# Patient Record
Sex: Female | Born: 1970 | Race: White | Hispanic: No | Marital: Married | Smoking: Current every day smoker
Health system: Southern US, Community
[De-identification: ages and names within clinical notes are randomized; demographics above are authoritative.]

## PROBLEM LIST (undated history)

## (undated) DIAGNOSIS — R87619 Unspecified abnormal cytological findings in specimens from cervix uteri: Secondary | ICD-10-CM

## (undated) DIAGNOSIS — N200 Calculus of kidney: Secondary | ICD-10-CM

## (undated) DIAGNOSIS — R32 Unspecified urinary incontinence: Secondary | ICD-10-CM

## (undated) DIAGNOSIS — F419 Anxiety disorder, unspecified: Secondary | ICD-10-CM

## (undated) HISTORY — PX: TUBAL LIGATION: SHX77

## (undated) HISTORY — PX: URETHRAL DILATION: SUR417

## (undated) HISTORY — DX: Unspecified urinary incontinence: R32

## (undated) HISTORY — DX: Calculus of kidney: N20.0

## (undated) HISTORY — PX: OTHER SURGICAL HISTORY: SHX169

## (undated) HISTORY — DX: Anxiety disorder, unspecified: F41.9

## (undated) HISTORY — DX: Unspecified abnormal cytological findings in specimens from cervix uteri: R87.619

---

## 2007-12-24 HISTORY — PX: BREAST SURGERY: SHX581

## 2007-12-24 HISTORY — PX: ABDOMINAL SURGERY: SHX537

## 2009-12-23 DIAGNOSIS — R87619 Unspecified abnormal cytological findings in specimens from cervix uteri: Secondary | ICD-10-CM

## 2009-12-23 HISTORY — PX: COLPOSCOPY: SHX161

## 2009-12-23 HISTORY — DX: Unspecified abnormal cytological findings in specimens from cervix uteri: R87.619

## 2013-03-24 ENCOUNTER — Ambulatory Visit
Admission: RE | Admit: 2013-03-24 | Discharge: 2013-03-24 | Disposition: A | Discharge status: Home / Self Care | Payer: Medicaid Other | Source: Ambulatory Visit | Attending: Otolaryngology | Admitting: Otolaryngology

## 2013-03-24 ENCOUNTER — Other Ambulatory Visit: Payer: Self-pay | Admitting: Otolaryngology

## 2013-03-24 DIAGNOSIS — H9209 Otalgia, unspecified ear: Secondary | ICD-10-CM

## 2013-03-24 DIAGNOSIS — H7090 Unspecified mastoiditis, unspecified ear: Secondary | ICD-10-CM

## 2013-03-24 DIAGNOSIS — H6690 Otitis media, unspecified, unspecified ear: Secondary | ICD-10-CM

## 2013-03-24 MED ORDER — IOHEXOL 300 MG/ML  SOLN
75.0000 mL | Freq: Once | INTRAMUSCULAR | Status: AC | PRN
  Administered 2013-03-24: 75 mL via INTRAVENOUS

## 2013-03-24 MED ORDER — IOHEXOL 300 MG/ML  SOLN: 75.0000 mL | Freq: Once | INTRAMUSCULAR | Status: DC | PRN

## 2013-09-09 ENCOUNTER — Other Ambulatory Visit: Payer: Self-pay | Admitting: Obstetrics and Gynecology

## 2013-09-09 DIAGNOSIS — R928 Other abnormal and inconclusive findings on diagnostic imaging of breast: Secondary | ICD-10-CM

## 2013-11-09 ENCOUNTER — Other Ambulatory Visit: Payer: Self-pay | Admitting: Obstetrics and Gynecology

## 2013-11-09 ENCOUNTER — Ambulatory Visit
Admission: RE | Admit: 2013-11-09 | Discharge: 2013-11-09 | Disposition: A | Discharge status: Home / Self Care | Payer: Medicaid Other | Source: Ambulatory Visit | Attending: Obstetrics and Gynecology | Admitting: Obstetrics and Gynecology

## 2013-11-09 ENCOUNTER — Ambulatory Visit
Admission: RE | Admit: 2013-11-09 | Discharge: 2013-11-09 | Disposition: A | Discharge status: Home / Self Care | Payer: BC Managed Care – PPO | Source: Ambulatory Visit | Attending: Obstetrics and Gynecology | Admitting: Obstetrics and Gynecology

## 2013-11-09 DIAGNOSIS — R928 Other abnormal and inconclusive findings on diagnostic imaging of breast: Secondary | ICD-10-CM

## 2014-03-15 ENCOUNTER — Ambulatory Visit (INDEPENDENT_AMBULATORY_CARE_PROVIDER_SITE_OTHER): Payer: BC Managed Care – PPO | Admitting: Podiatrist

## 2014-03-15 ENCOUNTER — Encounter: Payer: Self-pay | Admitting: Podiatrist

## 2014-03-15 VITALS — BP 112/71 | HR 64 | Resp 18

## 2014-03-15 DIAGNOSIS — S92309A Fracture of unspecified metatarsal bone(s), unspecified foot, initial encounter for closed fracture: Secondary | ICD-10-CM

## 2014-03-15 DIAGNOSIS — S9030XA Contusion of unspecified foot, initial encounter: Secondary | ICD-10-CM

## 2014-03-15 NOTE — Patient Instructions (Addendum)
Metatarsal Fracture, Undisplaced A metatarsal fracture is a break in the bone(s) of the foot. These are the bones of the foot that connect your toes to the bones of the ankle. DIAGNOSIS  The diagnoses of these fractures are usually made with X-rays. If there are problems in the forefoot and x-rays are normal a later bone scan will usually make the diagnosis.  TREATMENT AND HOME CARE INSTRUCTIONS  Treatment may or may not include a cast or walking shoe. When casts are needed the use is usually for short periods of time so as not to slow down healing with muscle wasting (atrophy).  Activities should be stopped until further advised by your caregiver.  Wear shoes with adequate shock absorbing capabilities and stiff soles.  Alternative exercise may be undertaken while waiting for healing. These may include bicycling and swimming, or as your caregiver suggests.  It is important to keep all follow-up visits or specialty referrals. The failure to keep these appointments could result in improper bone healing and chronic pain or disability.  Warning: Do not drive a car or operate a motor vehicle until your caregiver specifically tells you it is safe to do so. IF YOU DO NOT HAVE A CAST OR SPLINT:  You may walk on your injured foot as tolerated or advised.  Do not put any weight on your injured foot for as long as directed by your caregiver. Slowly increase the amount of time you walk on the foot as the pain allows or as advised.  Use crutches until you can bear weight without pain. A gradual increase in weight bearing may help.  Apply ice to the injury for 15-20 minutes each hour while awake for the first 2 days. Put the ice in a plastic bag and place a towel between the bag of ice and your skin.  Only take over-the-counter or prescription medicines for pain, discomfort, or fever as directed by your caregiver. SEEK IMMEDIATE MEDICAL CARE IF:   Your cast gets damaged or breaks.  You have  continued severe pain or more swelling than you did before the cast was put on, or the pain is not controlled with medications.  Your skin or nails below the injury turn blue or grey, or feel cold or numb.  There is a bad smell, or new stains or pus-like (purulent) drainage coming from the cast. MAKE SURE YOU:   Understand these instructions.  Will watch your condition.  Will get help right away if you are not doing well or get worse. Document Released: 08/31/2002 Document Revised: 03/02/2012 Document Reviewed: 07/22/2008 [facility] Patient Information 2014 [place].

## 2014-03-15 NOTE — Progress Notes (Signed)
   Subjective:    Patient ID: Phill Mutter, female    DOB: 07/18/1971, 43 y.o.   MRN: [id number]  HPI I was in my yard Sunday march 22,2015 and it was a concrete stone that was dropped in mid air on my left foot and sore and tender and blue color on toes and a lot of swelling and went to urgent care on Monday the 23rd and they x-rays and crutches and shoe and pain medicine and I have kept it up    Review of Systems  All other systems reviewed and are negative.       Objective:   Physical Exam GENERAL APPEARANCE: Alert, conversant. Appropriately groomed. No acute distress.  VASCULAR: Pedal pulses palpable at 2/4 DP and PT bilateral.  Capillary refill time is immediate to all digits,  Proximal to distal cooling it warm to warm.  Digital hair growth is present bilateral significant swelling of the left foot is noted in comparison with the right. NEUROLOGIC: sensation is intact epicritically and protectively to 5.07 monofilament at 5/5 sites bilateral.  Light touch is intact bilateral, no pain out of proportion to touch or dorsiflexion and plantarflexion is noted. MUSCULOSKELETAL: acceptable muscle strength, tone and stability bilateral.  Again significant swelling is present left DERMATOLOGIC: Notable swelling is present left foot in comparison with the right. Small excoriation over the third metatarsal base is also present. No ecchymosis or bruising is noted.   X-rays from Dr. Saul Fordyce office are evaluated and show possible fracture at the base of the third metatarsal left. No other osseous abnormalities were noted.    Assessment & Plan:  Fracture third metatarsal left versus contusion left foot  Plan: Put her in an air fracture walker to support the foot more. Instructed her on continued ice and elevation. She has already started on on hydrocodone and will call if she needs more next week. I'll see her back in one month for re\re x-ray of the foot.

## 2014-04-12 ENCOUNTER — Ambulatory Visit (INDEPENDENT_AMBULATORY_CARE_PROVIDER_SITE_OTHER): Payer: BC Managed Care – PPO | Admitting: Podiatrist

## 2014-04-12 ENCOUNTER — Ambulatory Visit (INDEPENDENT_AMBULATORY_CARE_PROVIDER_SITE_OTHER): Payer: BC Managed Care – PPO

## 2014-04-12 ENCOUNTER — Encounter: Payer: Self-pay | Admitting: Podiatrist

## 2014-04-12 VITALS — BP 105/67 | HR 68 | Resp 18

## 2014-04-12 DIAGNOSIS — R52 Pain, unspecified: Secondary | ICD-10-CM

## 2014-04-12 DIAGNOSIS — S9030XA Contusion of unspecified foot, initial encounter: Secondary | ICD-10-CM

## 2014-04-12 NOTE — Progress Notes (Signed)
The 2nd and 3rd toes I can bend some but the 4th and 5th toes on my left foot I can move a little bit and I stopped wearing the big boot last week and I am in this shoe  Subjective: Lori Quinn presents today for followup of foot bruise and a suspected stress fracture left foot. She states she got out of the boot and started wearing her Darco shoe again. Overall she states she's significantly improved and the swelling has gone down. She is concerned however that she can't bend her toes as well as she did before the injury.  Objective: Excellent appearance of the left foot is seen. Some discomfort around the third metatarsal at the base is present. No bruising, no swelling, no sign of injury is present today. Significant improvement noted from previous visit. The digits 2, 3, 4 are less able to plantarflex as the right foot. However this should resolve on its own over time. X-rays are reviewed and show no sign of obvious fracture or deformity left foot.   Assessment: Contusion/sprain left foot  Plan: Recommended that she wean out of her Darco shoe into a good supportive running shoe. Discussed continuing to exercise the toes to regain her strength and flexibility. This should be self limiting and should improve on its own.

## 2014-06-06 ENCOUNTER — Other Ambulatory Visit: Payer: Self-pay | Admitting: Obstetrics and Gynecology

## 2014-06-06 DIAGNOSIS — N6489 Other specified disorders of breast: Secondary | ICD-10-CM

## 2014-07-04 ENCOUNTER — Ambulatory Visit
Admission: RE | Admit: 2014-07-04 | Discharge: 2014-07-04 | Disposition: A | Discharge status: Home / Self Care | Payer: BC Managed Care – PPO | Source: Ambulatory Visit | Attending: Obstetrics and Gynecology | Admitting: Obstetrics and Gynecology

## 2014-07-04 DIAGNOSIS — N6489 Other specified disorders of breast: Secondary | ICD-10-CM

## 2015-01-18 ENCOUNTER — Other Ambulatory Visit: Payer: Self-pay | Admitting: Obstetrics and Gynecology

## 2015-01-18 DIAGNOSIS — N6489 Other specified disorders of breast: Secondary | ICD-10-CM

## 2015-07-26 ENCOUNTER — Other Ambulatory Visit: Payer: Self-pay | Admitting: Obstetrics and Gynecology

## 2015-07-26 ENCOUNTER — Ambulatory Visit
Admission: RE | Admit: 2015-07-26 | Discharge: 2015-07-26 | Disposition: A | Discharge status: Home / Self Care | Payer: Federal, State, Local not specified - PPO | Source: Ambulatory Visit | Attending: Obstetrics and Gynecology | Admitting: Obstetrics and Gynecology

## 2015-07-26 DIAGNOSIS — N6489 Other specified disorders of breast: Secondary | ICD-10-CM

## 2018-03-14 ENCOUNTER — Ambulatory Visit (HOSPITAL_COMMUNITY)
Admission: EM | Admit: 2018-03-14 | Discharge: 2018-03-14 | Disposition: A | Discharge status: Home / Self Care | Payer: Federal, State, Local not specified - PPO | Attending: Family Medicine | Admitting: Family Medicine

## 2018-03-14 ENCOUNTER — Encounter (HOSPITAL_COMMUNITY): Payer: Self-pay | Admitting: Family Medicine

## 2018-03-14 ENCOUNTER — Other Ambulatory Visit: Payer: Self-pay

## 2018-03-14 DIAGNOSIS — J40 Bronchitis, not specified as acute or chronic: Secondary | ICD-10-CM | POA: Insufficient documentation

## 2018-03-14 DIAGNOSIS — H65111 Acute and subacute allergic otitis media (mucoid) (sanguinous) (serous), right ear: Secondary | ICD-10-CM | POA: Diagnosis not present

## 2018-03-14 DIAGNOSIS — R509 Fever, unspecified: Secondary | ICD-10-CM | POA: Diagnosis present

## 2018-03-14 DIAGNOSIS — F17210 Nicotine dependence, cigarettes, uncomplicated: Secondary | ICD-10-CM | POA: Diagnosis not present

## 2018-03-14 DIAGNOSIS — R51 Headache: Secondary | ICD-10-CM | POA: Diagnosis present

## 2018-03-14 DIAGNOSIS — Z881 Allergy status to other antibiotic agents status: Secondary | ICD-10-CM | POA: Diagnosis not present

## 2018-03-14 DIAGNOSIS — H65191 Other acute nonsuppurative otitis media, right ear: Secondary | ICD-10-CM | POA: Diagnosis not present

## 2018-03-14 LAB — POCT URINALYSIS DIP (DEVICE)
Bilirubin Urine: NEGATIVE
Glucose, UA: NEGATIVE mg/dL
HGB URINE DIPSTICK: NEGATIVE
Ketones, ur: NEGATIVE mg/dL
Leukocytes, UA: NEGATIVE
Nitrite: NEGATIVE
PH: 7 (ref 5.0–8.0)
Protein, ur: NEGATIVE mg/dL
Specific Gravity, Urine: 1.01 (ref 1.005–1.030)
UROBILINOGEN UA: 0.2 mg/dL (ref 0.0–1.0)

## 2018-03-14 MED ORDER — PREDNISONE 20 MG PO TABS: ORAL_TABLET | ORAL | 0 refills | Status: DC

## 2018-03-14 MED ORDER — AMOXICILLIN-POT CLAVULANATE 875-125 MG PO TABS: 1.0000 | ORAL_TABLET | Freq: Two times a day (BID) | ORAL | 0 refills | Status: DC

## 2018-03-14 NOTE — Discharge Instructions (Addendum)
The urine test was negative.

## 2018-03-14 NOTE — ED Provider Notes (Signed)
[place]   [id number] 03/14/18 Arrival Time: [place]   [id number] 03/14/18 Arrival Time: 1909      SUBJECTIVE:  Lori Quinn is a 47 y.o. female who presents to the urgent care with complaint of Fever, sinus, headaches, ear pain, pressure when she urinates, wheezing a little, facial pressure, ibuprofen 2 hours ago,   Patient has been sick off and on for a few days but got much worse today.  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 She smokes cigarettes.   History reviewed. No pertinent past medical history. No family history on file. Social History   Socioeconomic History  . Marital status: Married    Spouse name: Not on file  . Number of children: Not on file  . Years of education: Not on file  . Highest education level: Not on file  Occupational History  . Not on file  Social Needs  . Financial resource strain: Not on file  . Food insecurity:    Worry: Not on file    Inability: Not on file  . Transportation needs:    Medical: Not on file    Non-medical: Not on file  Tobacco Use  . Smoking status: Current Every Day Smoker    Packs/day: 1.00    Types: Cigarettes  . Smokeless tobacco: Never Used  Substance and Sexual Activity  . Alcohol use: Yes  . Drug use: No  . Sexual activity: Not on file  Lifestyle  . Physical activity:    Days per week: Not on file    Minutes per session: Not on file  . Stress: Not on file  Relationships  . Social connections:    Talks on phone: Not on file    Gets together: Not on file    Attends religious service: Not on file    Active member of club or organization: Not on file    Attends meetings of clubs or organizations: Not on file    Relationship status: Not on file  . Intimate partner violence:    Fear of current or ex partner: Not on file    Emotionally abused: Not on file    Physically abused: Not on file    Forced sexual activity: Not on file  Other  Topics Concern  . Not on file  Social History Narrative  . Not on file   Current Meds  Medication Sig  . amphetamine-dextroamphetamine (ADDERALL) 20 MG tablet Take 20 mg by mouth daily.   Allergies  Allergen Reactions  . Cefdinir Nausea And Vomiting      ROS: As per HPI, remainder of ROS negative.   OBJECTIVE:   Vitals:   03/14/18 1921  BP: 133/79  Pulse: 80  Temp: 97.8 F (36.6 C)  TempSrc: Oral  SpO2: 100%     General appearance: alert; no distress Eyes: PERRL; EOMI; conjunctiva normal HENT: normocephalic; atraumatic; TMs dull on right and normal on left, canal normal, external ears normal without trauma; nasal mucosa normal; oral mucosa normal; mild maxillary tenderness Neck: supple Lungs: Expiratory wheezes on auscultation bilaterally Heart: regular rate and rhythm Back: no CVA tenderness Extremities: no cyanosis or edema; symmetrical with no gross deformities Skin: warm and dry Neurologic: normal gait; grossly normal Psychological: alert and cooperative; normal mood and affect      Labs:  Results for orders placed or performed during the hospital encounter of 03/14/18  POCT urinalysis dip (device)  Result Value Ref Range   Glucose, UA NEGATIVE NEGATIVE mg/dL   Bilirubin Urine NEGATIVE NEGATIVE   Ketones, ur NEGATIVE NEGATIVE mg/dL   Specific Gravity, Urine 1.010 1.005 - 1.030   Hgb urine dipstick NEGATIVE NEGATIVE   pH 7.0 5.0 - 8.0   Protein, ur NEGATIVE NEGATIVE mg/dL   Urobilinogen, UA 0.2 0.0 - 1.0 mg/dL   Nitrite NEGATIVE NEGATIVE   Leukocytes, UA NEGATIVE NEGATIVE    Labs Reviewed  URINE CULTURE  POCT URINALYSIS DIP (DEVICE)    No results found.     ASSESSMENT & PLAN:  1. Acute mucoid otitis media of right ear   2. Bronchitis     Meds ordered this encounter  Medications  . amoxicillin-clavulanate (AUGMENTIN) 875-125 MG tablet    Sig: Take 1 tablet by mouth every 12 (twelve) hours.    Dispense:  14 tablet    Refill:  0    . predniSONE (DELTASONE) 20 MG tablet    Sig: Two daily with food    Dispense:  10 tablet    Refill:  0    Reviewed expectations re: course of current medical issues. Questions answered. Outlined signs and symptoms indicating need for more acute intervention. Patient verbalized understanding. After Visit Summary given.       History reviewed. No pertinent past medical history. No family history on file. Social History   Socioeconomic History  . Marital status: Married    Spouse name: Not on file  . Number of children: Not on file  . Years of education: Not on file  . Highest education level: Not on file  Occupational History  . Not on file  Social Needs  . Financial resource strain: Not on file  . Food insecurity:    Worry: Not on file    Inability: Not on file  . Transportation needs:    Medical: Not on file    Non-medical: Not on file  Tobacco Use  . Smoking status: Current Every Day Smoker    Packs/day: 1.00    Types: Cigarettes  . Smokeless tobacco: Never Used  Substance and Sexual Activity  . Alcohol use: Yes  . Drug use: No  . Sexual activity: Not on file  Lifestyle  . Physical activity:    Days per week: Not on file    Minutes per session: Not on file  . Stress: Not on file  Relationships  . Social connections:    Talks on phone: Not on file    Gets together: Not on file    Attends religious service: Not on file    Active member of club or organization: Not on file    Attends meetings of clubs or organizations: Not on file    Relationship status: Not on file  . Intimate partner violence:    Fear of current or ex partner: Not on file    Emotionally abused: Not on file    Physically abused: Not on file    Forced sexual activity: Not on file  Other Topics Concern  . Not on file  Social History Narrative  . Not on file   Current Meds  Medication Sig  . amphetamine-dextroamphetamine (ADDERALL) 20 MG tablet Take 20 mg by mouth daily.    Allergies  Allergen Reactions  . Cefdinir Nausea And Vomiting      ROS: As per HPI, remainder of ROS negative.   OBJECTIVE:   Vitals:   03/14/18 1921  BP: 133/79  Pulse: 80  Temp: 97.8  F (36.6 C)  TempSrc: Oral  SpO2: 100%     General appearance: alert; no distress Eyes: PERRL; EOMI; conjunctiva normal HENT: normocephalic; atraumatic; TMs normal, canal normal, external ears normal without trauma; nasal mucosa normal; oral mucosa normal Neck: supple Lungs: clear to auscultation bilaterally Heart: regular rate and rhythm Abdomen: soft, non-tender; bowel sounds normal; no masses or organomegaly; no guarding or rebound tenderness Back: no CVA tenderness Extremities: no cyanosis or edema; symmetrical with no gross deformities Skin: warm and dry Neurologic: normal gait; grossly normal Psychological: alert and cooperative; normal mood and affect      Labs:  Results for orders placed or performed during the hospital encounter of 03/14/18  POCT urinalysis dip (device)  Result Value Ref Range   Glucose, UA NEGATIVE NEGATIVE mg/dL   Bilirubin Urine NEGATIVE NEGATIVE   Ketones, ur NEGATIVE NEGATIVE mg/dL   Specific Gravity, Urine 1.010 1.005 - 1.030   Hgb urine dipstick NEGATIVE NEGATIVE   pH 7.0 5.0 - 8.0   Protein, ur NEGATIVE NEGATIVE mg/dL   Urobilinogen, UA 0.2 0.0 - 1.0 mg/dL   Nitrite NEGATIVE NEGATIVE   Leukocytes, UA NEGATIVE NEGATIVE    Labs Reviewed  URINE CULTURE  POCT URINALYSIS DIP (DEVICE)    No results found.     ASSESSMENT & PLAN:  1. Acute mucoid otitis media of right ear   2. Bronchitis     Meds ordered this encounter  Medications  . amoxicillin-clavulanate (AUGMENTIN) 875-125 MG tablet    Sig: Take 1 tablet by mouth every 12 (twelve) hours.    Dispense:  14 tablet    Refill:  0  . predniSONE (DELTASONE) 20 MG tablet    Sig: Two daily with food    Dispense:  10 tablet    Refill:  0    Reviewed expectations re: course of  current medical issues. Questions answered. Outlined signs and symptoms indicating need for more acute intervention. Patient verbalized understanding. After Visit Summary given.    Procedures:      Robyn Haber, MD 03/14/18 2000

## 2018-03-14 NOTE — ED Triage Notes (Addendum)
Fever, sinus, headaches, ear pain, pressure when she urinates, wheezing a little, facial pressure, ibuprofen 2 hours ago,

## 2018-03-15 LAB — URINE CULTURE: Culture: NO GROWTH

## 2018-08-26 ENCOUNTER — Telehealth: Payer: Self-pay | Admitting: Obstetrics and Gynecology

## 2018-08-26 NOTE — Telephone Encounter (Signed)
Called and left a message for patient to call back to schedule a new patient doctor referral appointment with our office to see Dr. Quincy Simmonds for: Female stress incontinence.

## 2018-08-27 NOTE — Telephone Encounter (Signed)
Called and left a message for patient to call back to schedule a new patient doctor referral appointment with our office to see Dr. Quincy Simmonds for: Female stress incontinence.

## 2018-08-31 NOTE — Telephone Encounter (Signed)
Called and left a message for patient to call back to schedule a new patient doctor referral appointment with our officeto see Dr. Quincy Simmonds for: Female stress incontinence.

## 2018-09-02 NOTE — Telephone Encounter (Signed)
Routing referral back to referring office. Patient has not returned multiple calls to schedule an appointment with our office.

## 2018-12-09 ENCOUNTER — Encounter

## 2018-12-09 ENCOUNTER — Encounter: Payer: Self-pay | Admitting: Obstetrics and Gynecology

## 2018-12-09 ENCOUNTER — Other Ambulatory Visit: Payer: Self-pay

## 2018-12-09 ENCOUNTER — Ambulatory Visit (INDEPENDENT_AMBULATORY_CARE_PROVIDER_SITE_OTHER): Payer: Federal, State, Local not specified - PPO | Admitting: Obstetrics and Gynecology

## 2018-12-09 VITALS — BP 120/78 | HR 80 | Resp 18 | Ht 66.0 in | Wt 140.4 lb

## 2018-12-09 DIAGNOSIS — N3946 Mixed incontinence: Secondary | ICD-10-CM | POA: Diagnosis not present

## 2018-12-09 DIAGNOSIS — N8110 Cystocele, unspecified: Secondary | ICD-10-CM

## 2018-12-09 NOTE — Progress Notes (Signed)
47 y.o. [id number] Married Caucasian female here for uterine prolapse.    Having bladder problems.  Leaking urine with cough, exercise, laugh, or very little straining.  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  No leak for no reason.  Using a panty liner.   DF - every 3 - 4 hours. NF - none.  No enuresis.   Does not think she voids well.   Has of a lot of UTIs, none recently.  Last UTI was July 2019.  Prior UTI she does not remember.  Hx renal stones.   Denies hematuria.  Does splinting on the perineum to have BMs, not very often.  Can use a stool softener sometimes.  No fecal incontinence.   Has pelvic heaviness.   2 menses in the last year.   Patient state she is looking for guidance regarding what she should do for her care.   PCP:  None Referred by Dr.Robert Stann [place]   Patient's last menstrual period was 10/27/2018 (exact date).     Period Pattern: (!) Irregular     Sexually active: Yes.   female The current method of family planning is tubal ligation/ablation.    Exercising: No.  The patient does not participate in regular exercise at present. Smoker:  Yes, smokes 1ppd  Health Maintenance: Pap:  11/2017 normal per patient History of abnormal Pap:  Yes, hx of colpo 2011--paps normal since MMG: 11/2017 normal per patient at Dr.Wein's office Colonoscopy: At age 55 for rectal bleeding/polyps;was told to repeat 10-15 years BMD:   n/a  Result  n/a TDaP:  2012 Gardasil:   no HIV: Neg years ago Hep C:Neg years ago    reports that she has been smoking cigarettes. She has been smoking about 1.00 pack per day. She has never used smokeless tobacco. She reports previous alcohol use. She reports that she does not use drugs.  Past Medical History:  Diagnosis Date  . Abnormal Pap smear of cervix 2011   had colposcopy with Dr.Wein--paps normal since  . Anxiety   . Renal stones   . Urinary incontinence     Past Surgical History:  Procedure  Laterality Date  . ABDOMINAL SURGERY  12/24/2007   tummy tuck  . abdominoplasty surgery    . BREAST SURGERY  12/24/2007   Saline breast implants  . COLPOSCOPY  2011   Dr.Wein  . TUBAL LIGATION    . URETHRAL DILATION     done at 47 yo    Current Outpatient Medications  Medication Sig Dispense Refill  . amphetamine-dextroamphetamine (ADDERALL) 20 MG tablet Take 20 mg by mouth daily.    . cyclobenzaprine (FLEXERIL) 10 MG tablet Take 10 mg by mouth 3 (three) times daily as needed for muscle spasms.     No current facility-administered medications for this visit.     Family History  Problem Relation Age of Onset  . Breast cancer Mother 44  . Hypertension Mother   . Hypertension Father   . Heart attack Father   . Heart attack Brother   . Diabetes Paternal Grandmother   . Stroke Paternal Grandmother   . Hypertension Paternal Grandfather   . Heart attack Paternal Grandfather   . Hypertension Brother     Review of Systems  All other systems reviewed and are negative.   Exam:   BP 120/78 (BP Location: Right Arm, Patient Position: Sitting, Cuff Size: Normal)   Pulse 80   Resp 18  Ht 5' 6"  (1.676 m)   Wt 140 lb 6.4 oz (63.7 kg)   LMP 10/27/2018 (Exact Date)   BMI 22.66 kg/m     General appearance: alert, cooperative and appears stated age Head: Normocephalic, without obvious abnormality, atraumatic Neck: no adenopathy, supple, symmetrical, trachea midline and thyroid normal to inspection and palpation Lungs: clear to auscultation bilaterally Heart: regular rate and rhythm Abdomen: soft, non-tender; no masses, no organomegaly Extremities: extremities normal, atraumatic, no cyanosis or edema Skin: Skin color, texture, turgor normal. No rashes or lesions Lymph nodes: Cervical, supraclavicular, and axillary nodes normal. No abnormal inguinal nodes palpated Neurologic: Grossly normal  Pelvic: External genitalia:  no lesions              Urethra:  normal appearing urethra  with no masses, tenderness or lesions              Bartholins and Skenes: normal                 Vagina: normal appearing vagina with normal color and discharge, no lesions.  First degree prolapse of bladder.  Good apical and posterior vaginal support.               Cervix: no lesions       Bimanual Exam:  Uterus:  normal size, contour, position, consistency, mobility, non-tender              Adnexa: no mass, fullness, tenderness              Rectal exam: Yes.  .  Confirms.              Anus:  normal sphincter tone, no lesions  Chaperone was present for exam.  Assessment:    Mixed incontinence.  Stress greater than urge.  Cystocele.  Status post BTL.  Smoker.   Plan:  We discussed her incontinence and prolapse including etiologies and options for care. I did address smoking as a risk factor for prolapse and incontinence.  Treatment options include physical therapy, anticholinergic/antimuscarininc therapy, reduction of bladder irritant use, Impressa, pessary use, and surgical repair with midurethal sling/cystoscopy.  I reviewed midurthral sling risks and benefits.  ACOG materials given to patient on prolapse and incontinence in general and also surgical care for prolapse and incontinence.  Patient will start with referral for physical therapy.  I reinforced that her care plan can be modified if she wishes and that the goal is to improve the quality of her life and functioning.  If surgery is chosen, she will need urodynamic testing.  Procedure explained.  Questions invited and answered.   ___45____ minutes face to face time of which over 50% was spent in counseling.   After visit summary provided.

## 2018-12-12 DIAGNOSIS — N8110 Cystocele, unspecified: Secondary | ICD-10-CM | POA: Insufficient documentation

## 2018-12-12 DIAGNOSIS — N3946 Mixed incontinence: Secondary | ICD-10-CM | POA: Insufficient documentation

## 2018-12-12 NOTE — Addendum Note (Signed)
Addended by: Yisroel Ramming, Dietrich Pates E on: 12/12/2018 07:17 PM   Modules accepted: Orders

## 2018-12-30 ENCOUNTER — Telehealth: Payer: Self-pay | Admitting: Obstetrics and Gynecology

## 2018-12-30 NOTE — Telephone Encounter (Signed)
Call placed in reference to referral to [facility].

## 2019-02-02 ENCOUNTER — Telehealth: Payer: Self-pay | Admitting: Obstetrics and Gynecology

## 2019-02-02 NOTE — Telephone Encounter (Signed)
-----   Message ----- From: Nunzio Cobbs, MD Sent: 02/02/2019  12:06 PM EST To: Lucienne Minks Subject: RE: Urology- Referral                          Ok to close referral.   Thank you,   Brook   ----- Message ----- From: Lucienne Minks Sent: 02/02/2019  11:45 AM EST To: Brook Oletta Lamas, MD Subject: Urology- Referral                              Dr. Quincy Simmonds,  Per [place] at [facility]- she has called the patient and left several messages no response. I have also called the patient and left a message for a return call. Please adivise on how to proceed with the referral.

## 2022-02-06 ENCOUNTER — Other Ambulatory Visit: Payer: Self-pay | Admitting: Obstetrics & Gynecology

## 2022-02-06 ENCOUNTER — Ambulatory Visit
Admission: RE | Admit: 2022-02-06 | Discharge: 2022-02-06 | Disposition: A | Discharge status: Home / Self Care | Payer: Federal, State, Local not specified - PPO | Source: Ambulatory Visit | Attending: Obstetrics & Gynecology | Admitting: Obstetrics & Gynecology

## 2022-02-06 DIAGNOSIS — Z1231 Encounter for screening mammogram for malignant neoplasm of breast: Secondary | ICD-10-CM

## 2022-02-28 LAB — EXTERNAL GENERIC LAB PROCEDURE: COLOGUARD: NEGATIVE

## 2023-01-27 ENCOUNTER — Other Ambulatory Visit: Payer: Self-pay | Admitting: Obstetrics & Gynecology

## 2023-01-27 DIAGNOSIS — Z1231 Encounter for screening mammogram for malignant neoplasm of breast: Secondary | ICD-10-CM

## 2023-03-11 ENCOUNTER — Ambulatory Visit
Admission: RE | Admit: 2023-03-11 | Discharge: 2023-03-11 | Disposition: A | Discharge status: Home / Self Care | Payer: Federal, State, Local not specified - PPO | Source: Ambulatory Visit | Attending: Obstetrics & Gynecology | Admitting: Obstetrics & Gynecology

## 2023-03-11 DIAGNOSIS — Z1231 Encounter for screening mammogram for malignant neoplasm of breast: Secondary | ICD-10-CM

## 2023-11-02 IMAGING — MG DIGITAL SCREENING BREAST BILAT IMPLANT W/ TOMO W/ CAD
8 of 12 series · 8 of 28 positions shown · non-contrast
Comparison: Previous exam(s).

CLINICAL DATA: Screening.

EXAM:
DIGITAL SCREENING BILATERAL MAMMOGRAM WITH IMPLANTS, CAD AND
TOMOSYNTHESIS
TECHNIQUE: Bilateral screening digital craniocaudal and mediolateral oblique
mammograms were obtained. Bilateral screening digital breast
tomosynthesis was performed. The images were evaluated with
computer-aided detection. Standard and/or implant displaced views
were performed.

[R CC]
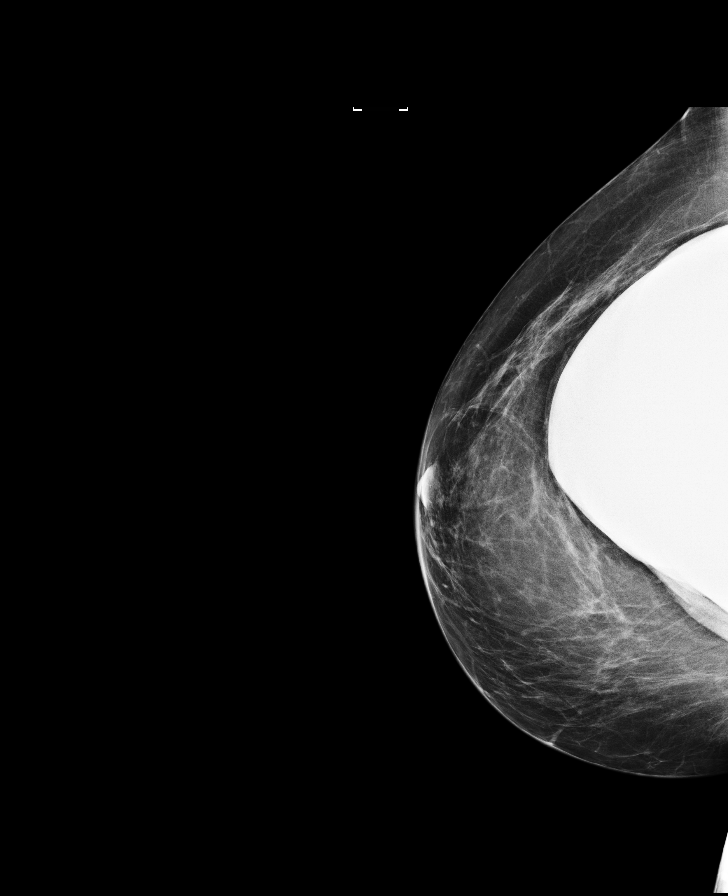

[L MLO]
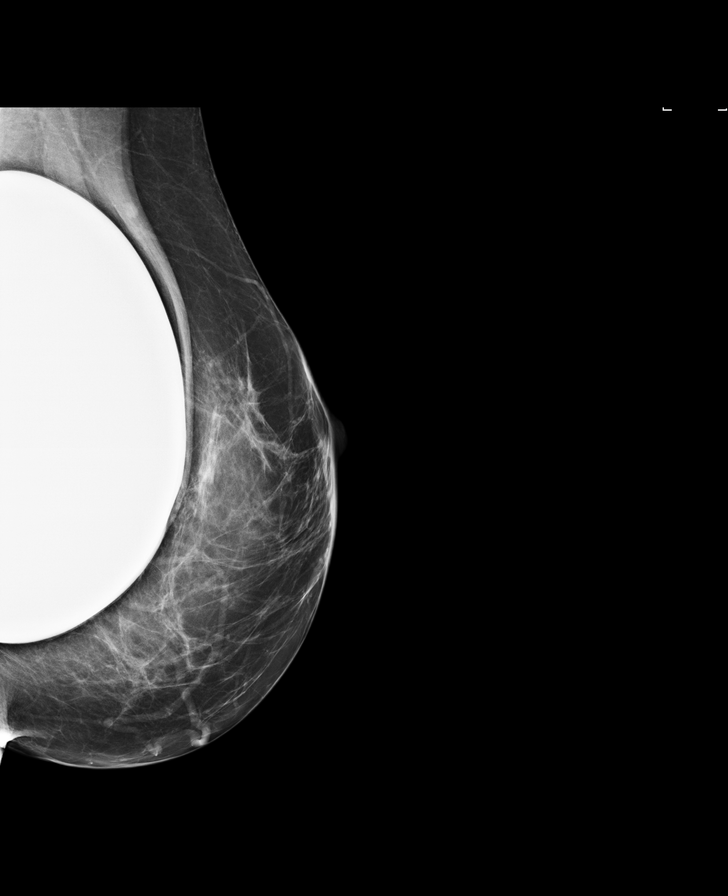

[L CC]
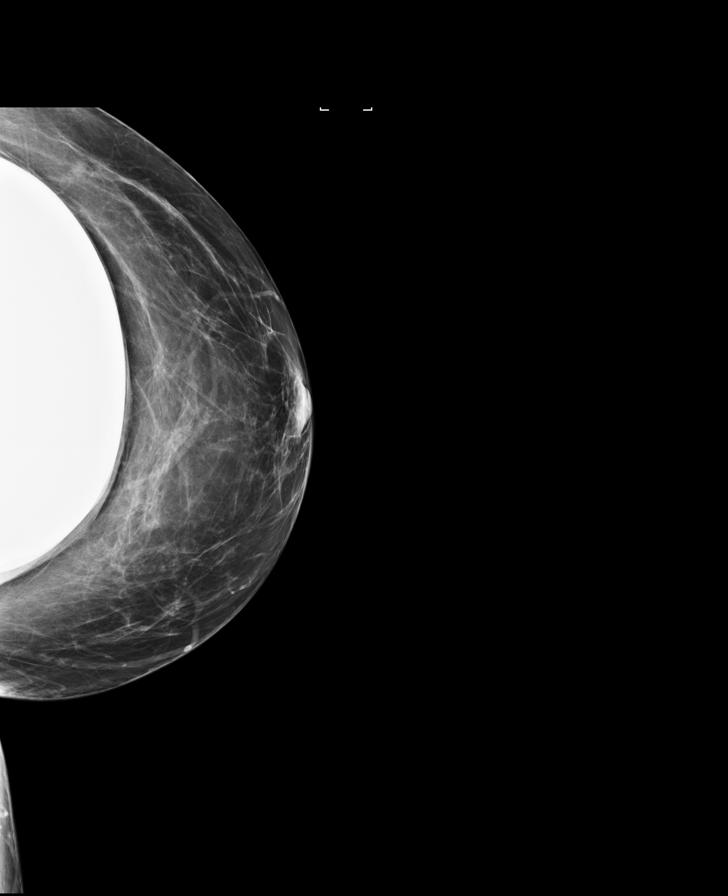

[R MLO]
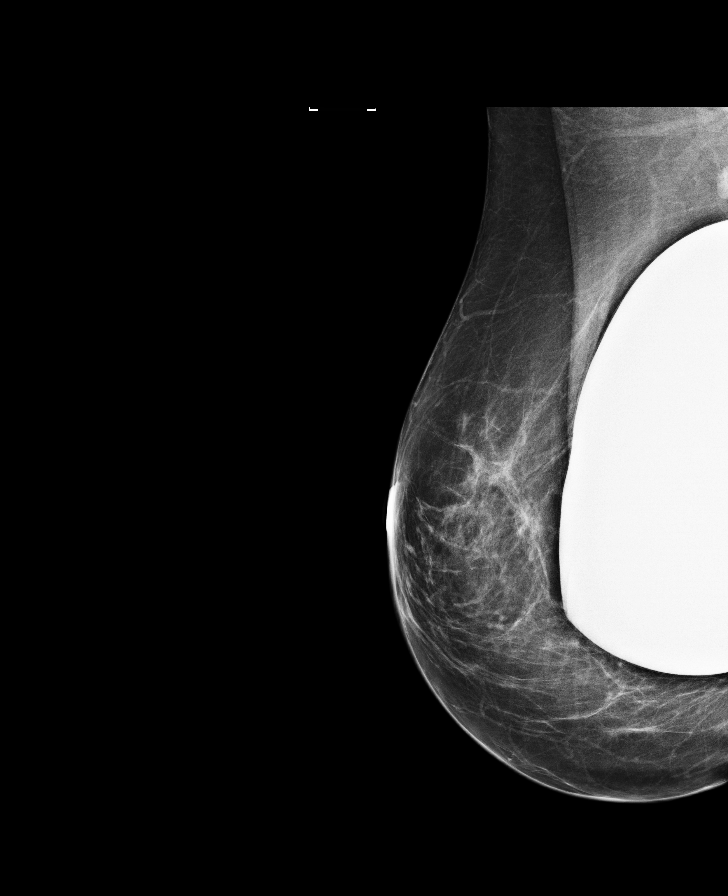

[L MLO synth-2D]
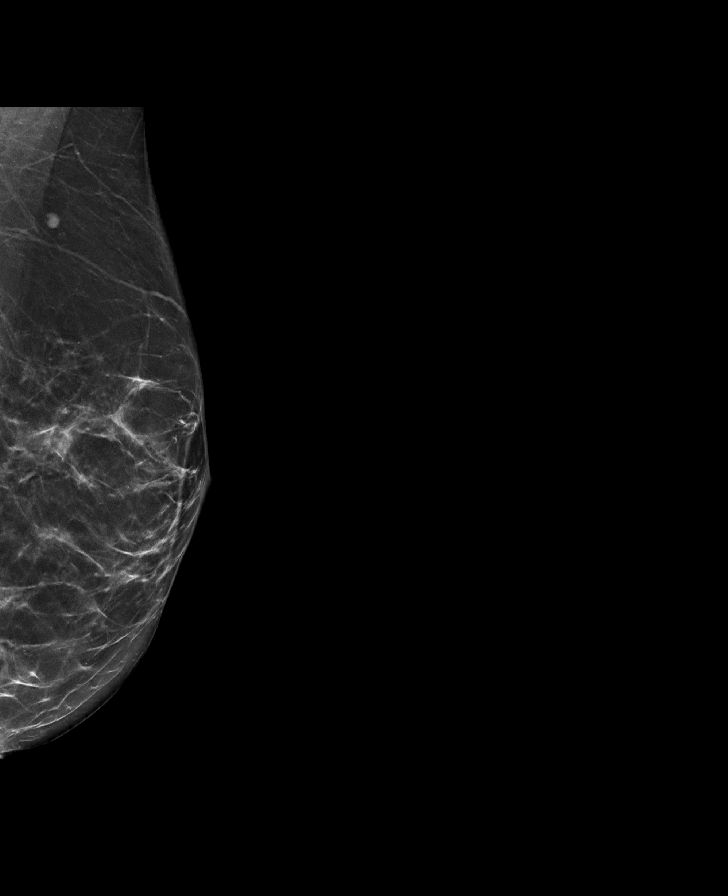

[R CC synth-2D]
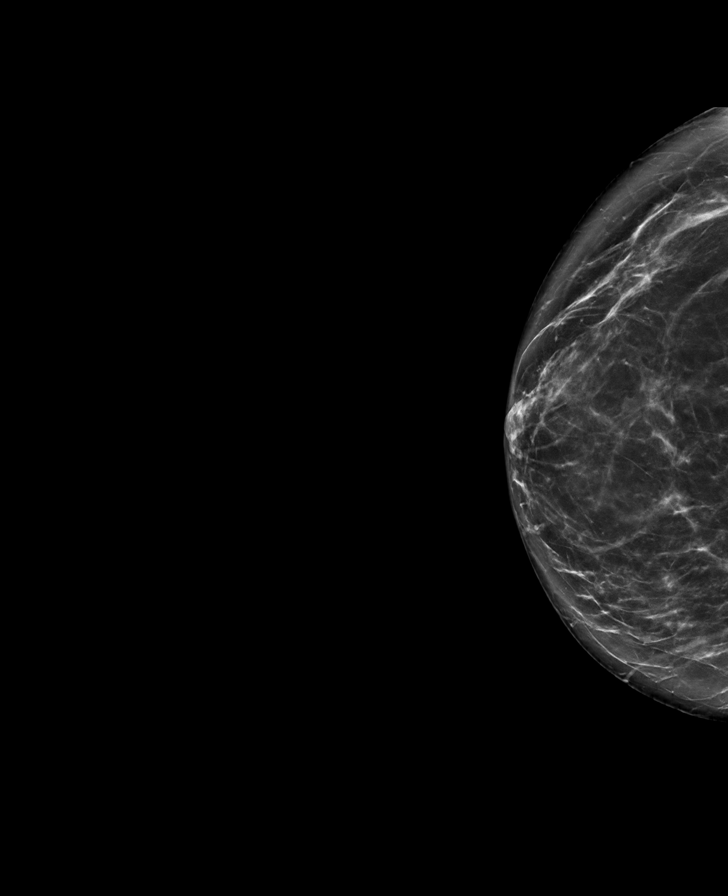

[L CC synth-2D]
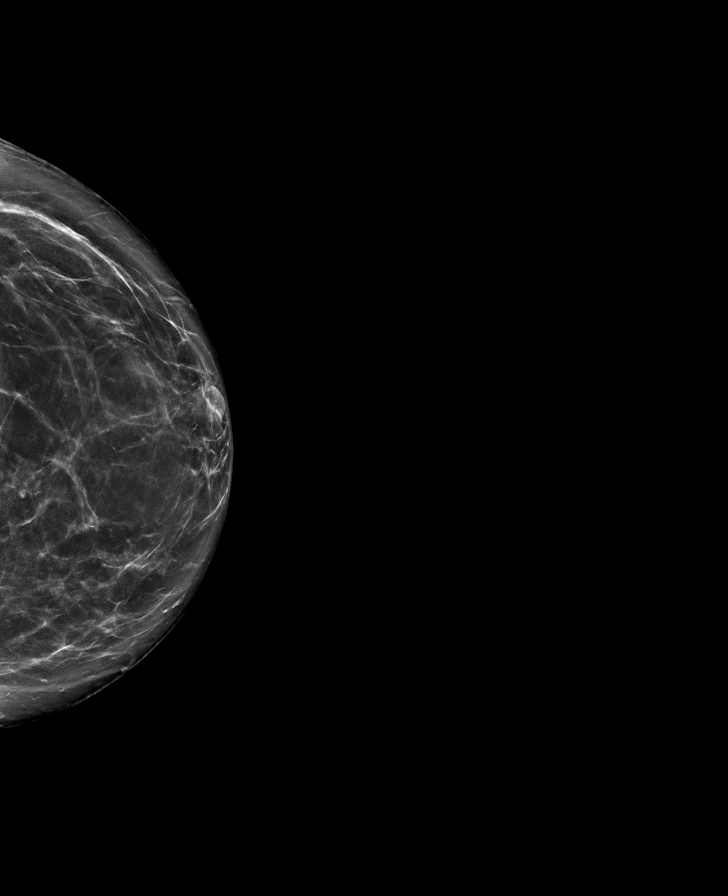

[R MLO synth-2D]
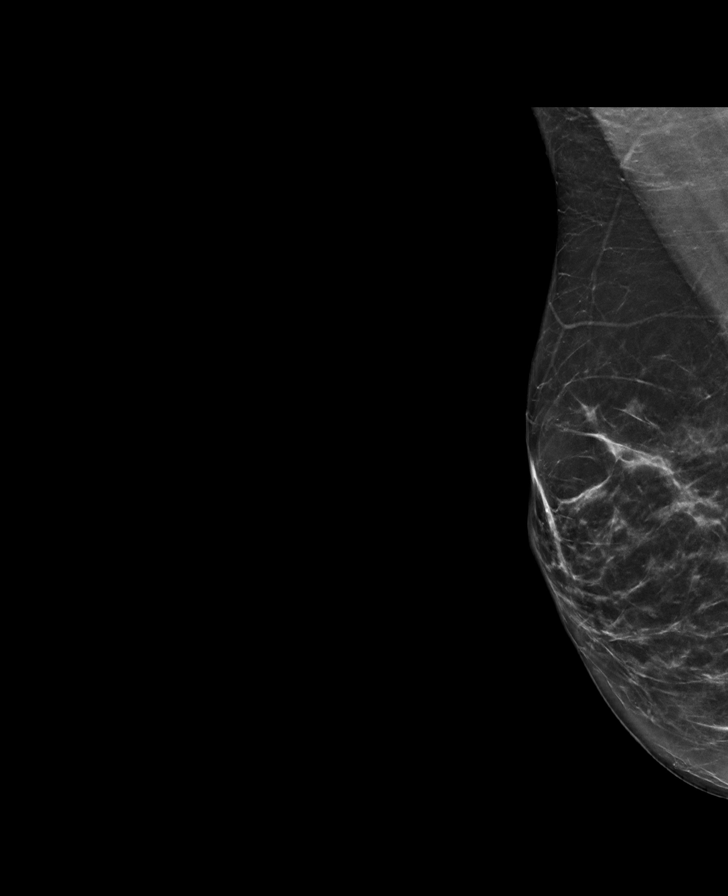

[8 of 28 positions shown; findings below may reference images not displayed]

ACR Breast Density Category b: There are scattered areas of
fibroglandular density.
FINDINGS: The patient has retropectoral implants. There are no findings
suspicious for malignancy.
IMPRESSION: No mammographic evidence of malignancy. A result letter of this
screening mammogram will be mailed directly to the patient.

RECOMMENDATION:
Screening mammogram in one year. (Code:SE-S-JMG)

BI-RADS CATEGORY  1:  Negative.

## 2024-02-09 ENCOUNTER — Other Ambulatory Visit: Payer: Self-pay | Admitting: Obstetrics & Gynecology

## 2024-02-09 DIAGNOSIS — Z1231 Encounter for screening mammogram for malignant neoplasm of breast: Secondary | ICD-10-CM

## 2024-03-11 ENCOUNTER — Ambulatory Visit
Admission: RE | Admit: 2024-03-11 | Discharge: 2024-03-11 | Disposition: A | Discharge status: Home / Self Care | Payer: Federal, State, Local not specified - PPO | Source: Ambulatory Visit | Attending: Obstetrics & Gynecology | Admitting: Obstetrics & Gynecology

## 2024-03-11 DIAGNOSIS — Z1231 Encounter for screening mammogram for malignant neoplasm of breast: Secondary | ICD-10-CM
